# Patient Record
Sex: Male | Born: 1985 | Race: Black or African American | Hispanic: No | Marital: Single | Smoking: Never smoker
Health system: Southern US, Community
[De-identification: ages and names within clinical notes are randomized; demographics above are authoritative.]

## PROBLEM LIST (undated history)

## (undated) HISTORY — PX: EYE SURGERY: SHX253

---
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
## 2007-07-12 IMAGING — CR RIGHT FOOT COMPLETE - 3+ VIEW
1 series · 3 of 3 positions shown · non-contrast
Comparison: none

REASON FOR EXAM: Injury
COMMENTS:  LMP: (Male)

PROCEDURE:     DXR - DXR FOOT RT COMPLETE W/OBLIQUES  - May 23, 2006  [DATE]
RESULT:     Three views of the RIGHT foot show no fracture, dislocation or
other acute bony abnormality.

[Series 1: view not recorded · 0.17mm/px · 3 of 3 slices shown]
[im 1/3]
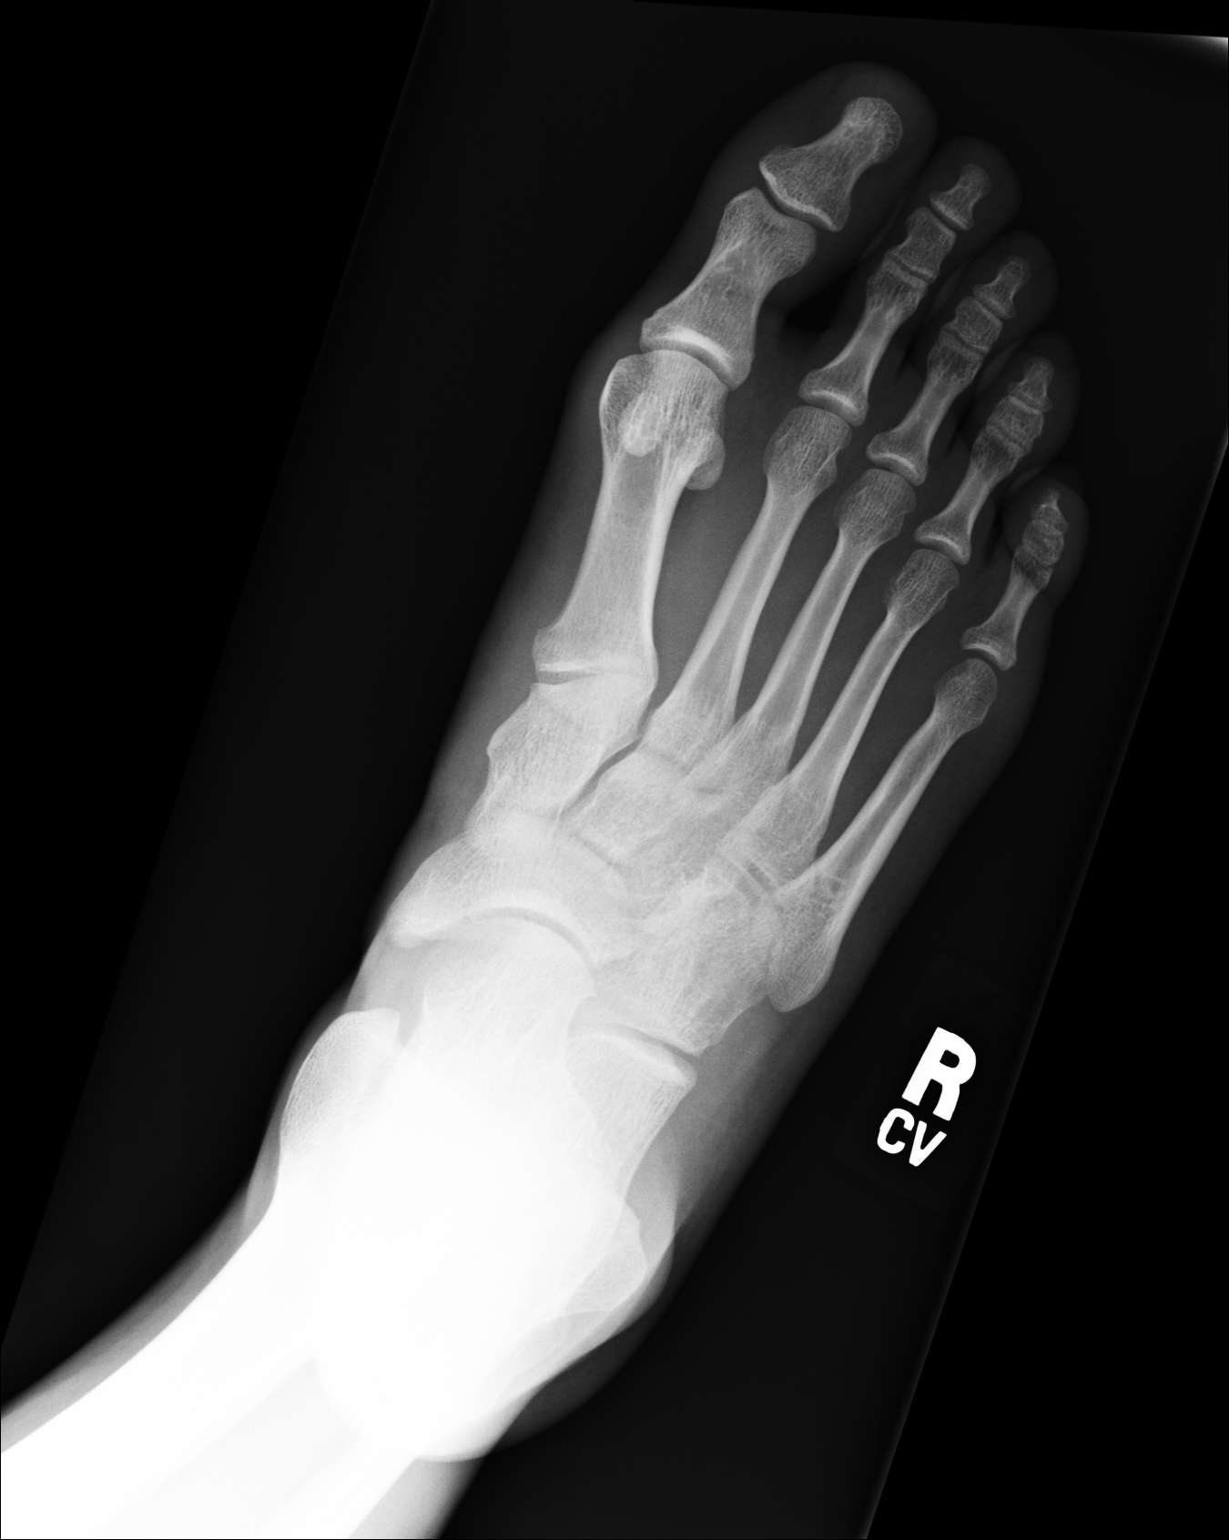
[im 2/3]
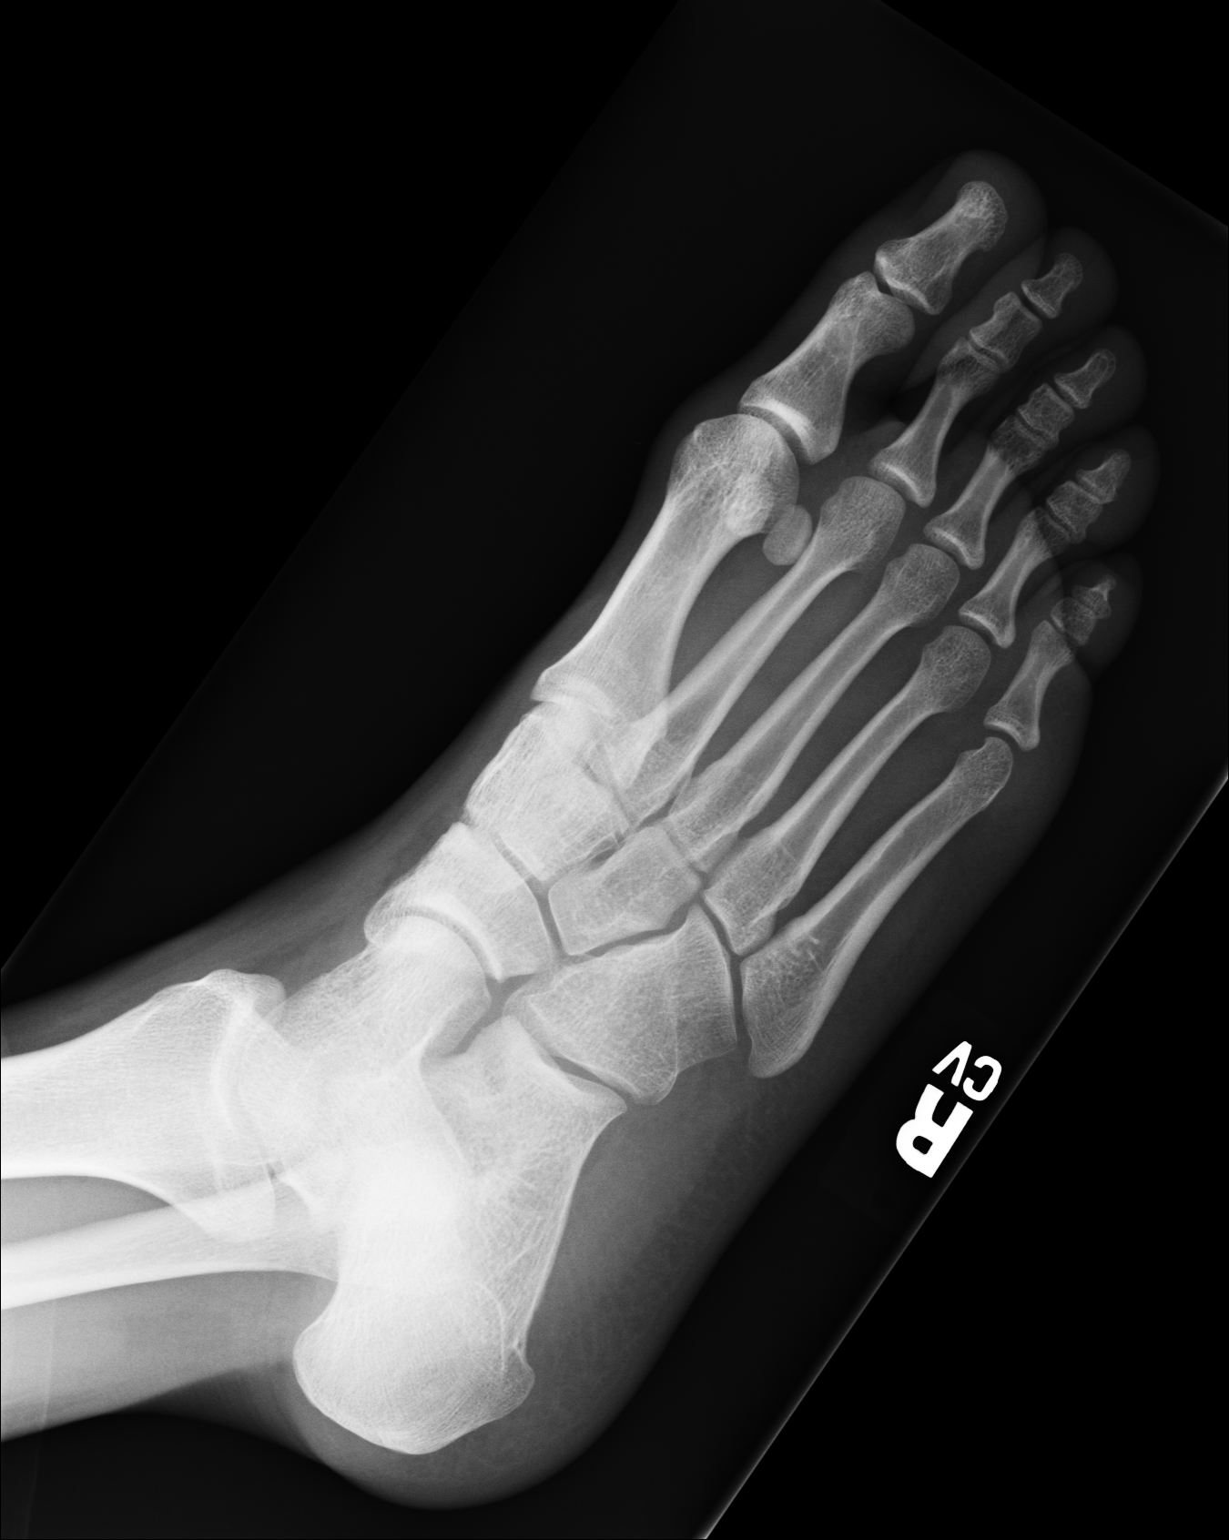
[im 3/3]
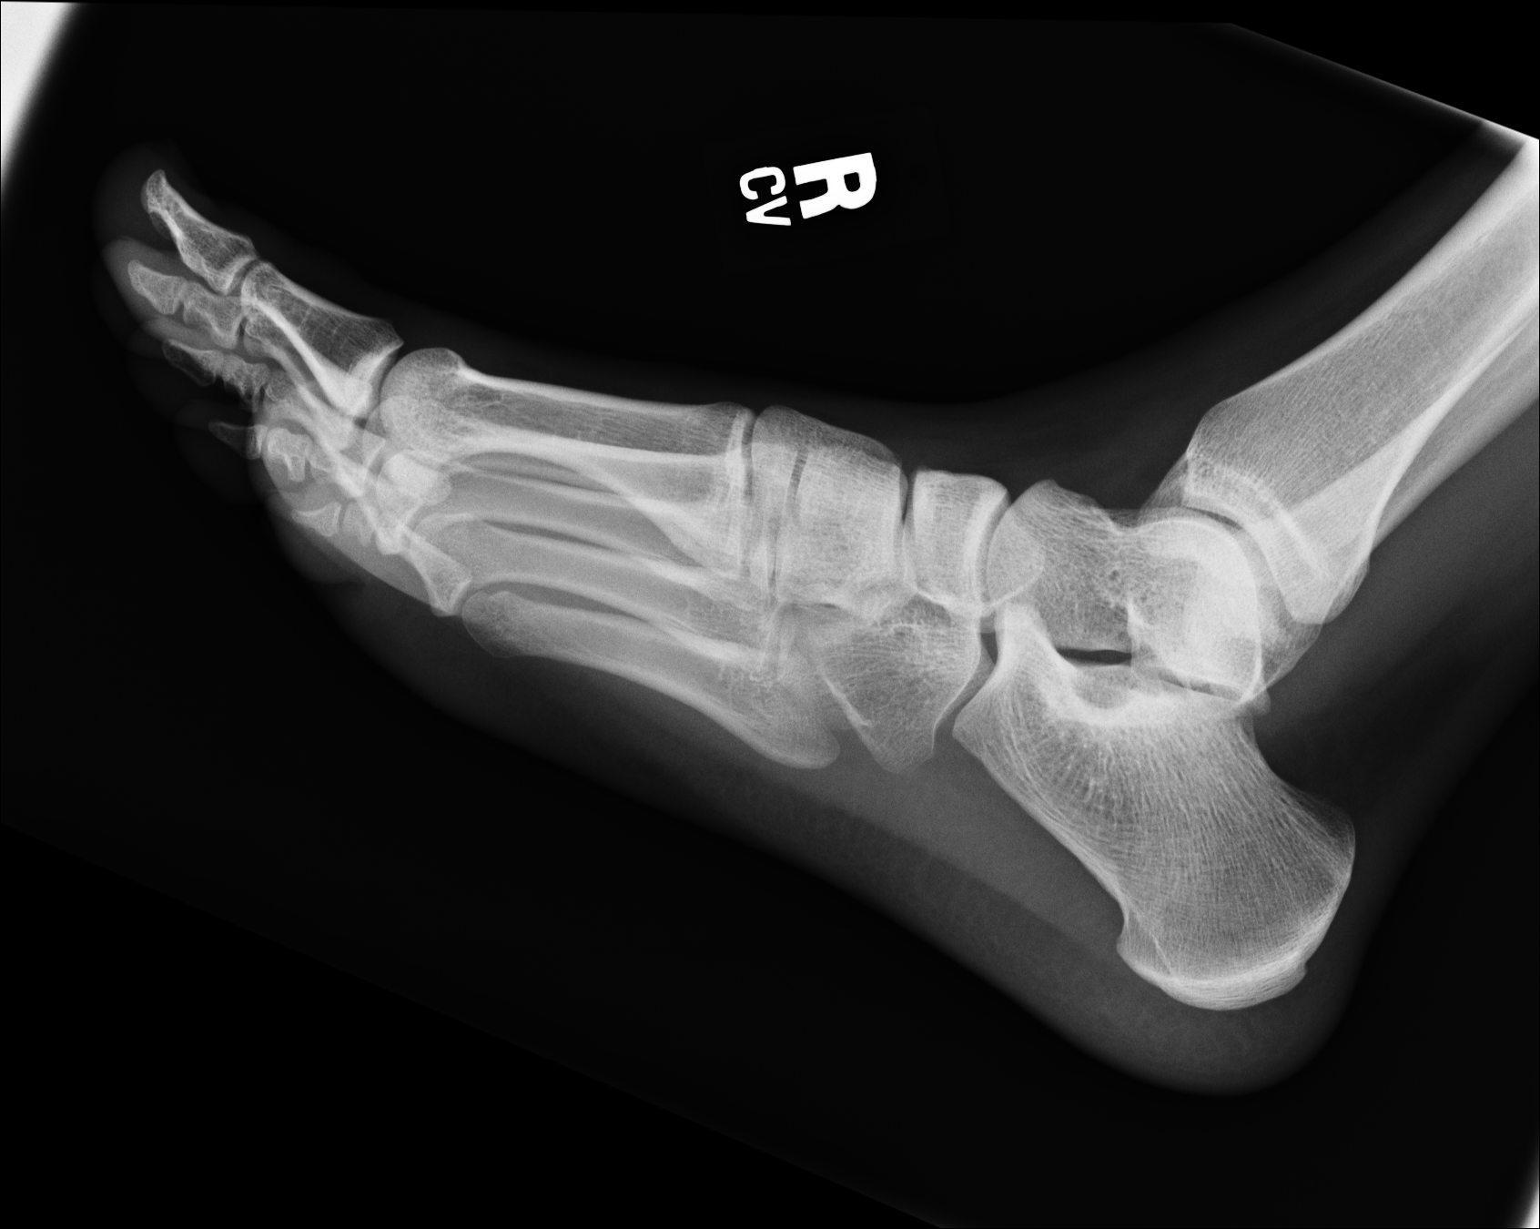

[3 of 3 positions shown; findings below may reference images not displayed]

IMPRESSION: No acute changes are identified.

## 2014-02-21 ENCOUNTER — Encounter (HOSPITAL_COMMUNITY): Payer: Self-pay | Admitting: Emergency Medicine

## 2014-02-21 ENCOUNTER — Emergency Department (HOSPITAL_COMMUNITY)
Admission: EM | Admit: 2014-02-21 | Discharge: 2014-02-21 | Disposition: A | Discharge status: Home / Self Care | Payer: Self-pay | Attending: Emergency Medicine | Admitting: Emergency Medicine

## 2014-02-21 DIAGNOSIS — K0889 Other specified disorders of teeth and supporting structures: Secondary | ICD-10-CM

## 2014-02-21 DIAGNOSIS — K089 Disorder of teeth and supporting structures, unspecified: Secondary | ICD-10-CM | POA: Insufficient documentation

## 2014-02-21 DIAGNOSIS — Z792 Long term (current) use of antibiotics: Secondary | ICD-10-CM | POA: Insufficient documentation

## 2014-02-21 MED ORDER — ONDANSETRON 4 MG PO TBDP
8.0000 mg | ORAL_TABLET | Freq: Once | ORAL | Status: AC
  Administered 2014-02-21: 8 mg via ORAL
  Filled 2014-02-21: qty 2

## 2014-02-21 MED ORDER — HYDROCODONE-ACETAMINOPHEN 5-325 MG PO TABS: 1.0000 | ORAL_TABLET | Freq: Four times a day (QID) | ORAL | Status: AC | PRN

## 2014-02-21 MED ORDER — PENICILLIN V POTASSIUM 500 MG PO TABS: 500.0000 mg | ORAL_TABLET | Freq: Four times a day (QID) | ORAL | Status: AC

## 2014-02-21 MED ORDER — HYDROCODONE-ACETAMINOPHEN 5-325 MG PO TABS
2.0000 | ORAL_TABLET | Freq: Once | ORAL | Status: AC
  Administered 2014-02-21: 2 via ORAL
  Filled 2014-02-21: qty 2

## 2014-02-21 NOTE — ED Provider Notes (Signed)
CSN: [id number]     Arrival date & time 02/21/14  4098 History  This chart was scribed for non-physician practitioner Junious Silk PA-C working with Gerhard Munch, MD by Annye Asa, ED Scribe. This patient was seen in room TR07C/TR07C and the patient's care was started at 9:25 AM.     Chief Complaint  Patient presents with  . Dental Pain   The history is provided by the patient. No language interpreter was used.    HPI Comments: Alexander Moore is a 28 y.o. male who presents to the Emergency Department complaining of gradual onset, constant, worsening dental pain (bilateral lower wisdom) beginning two weeks ago with associated jaw pain. Patient states that his pain is a sharp, cutting pain rated 10/10 at present. He has not seen a dentist at this time due to financial limitations. He is currently taking pain medications (hydrocodone, prescribed by physicians at another hospital) but states they do not alleviate his pain. He denies fevers and chills.    History reviewed. No pertinent past medical history. Past Surgical History  Procedure Laterality Date  . Eye surgery     No family history on file. History  Substance Use Topics  . Smoking status: Never Smoker   . Smokeless tobacco: Not on file  . Alcohol Use: No    Review of Systems  Constitutional: Negative for fever and chills.  HENT: Positive for dental problem.   All other systems reviewed and are negative.    Allergies  Review of patient's allergies indicates not on file.  Home Medications   Prior to Admission medications   Medication Sig Start Date End Date Taking? Authorizing Provider  PRESCRIPTION MEDICATION Take 1 tablet by mouth daily. antibiotic   Yes Historical Provider, MD  PRESCRIPTION MEDICATION Take 1 tablet by mouth every 4 (four) hours. Pain medication   Yes Historical Provider, MD   BP 144/98  Pulse 74  Temp(Src) 97.9 F (36.6 C) (Oral)  Resp 17  Ht  (1.753 m)  Wt 207 lb (93.895 kg)  BMI  30.55 kg/m2  SpO2 100% Physical Exam  Nursing note and vitals reviewed. Constitutional: He is oriented to person, place, and time. He appears well-developed and well-nourished. No distress.  HENT:  Head: Normocephalic and atraumatic.  Right Ear: External ear normal.  Left Ear: External ear normal.  Nose: Nose normal.  Erupting lower wisdom teeth bilaterally with surrounding gum irritation No drainable abscess No trismus, submental edema, no tongue elevation  Eyes: Conjunctivae are normal.  Neck: Normal range of motion. No tracheal deviation present.  Cardiovascular: Normal rate, regular rhythm and normal heart sounds.   Pulmonary/Chest: Effort normal and breath sounds normal. No stridor.  Abdominal: Soft. He exhibits no distension. There is no tenderness.  Musculoskeletal: Normal range of motion.  Neurological: He is alert and oriented to person, place, and time.  Skin: Skin is warm and dry. He is not diaphoretic.  Psychiatric: He has a normal mood and affect. His behavior is normal.    ED Course  Procedures  DIAGNOSTIC STUDIES: Oxygen Saturation is 100% on RA, normal by my interpretation.    COORDINATION OF CARE: 9:25 AM Discussed treatment plan with pt at bedside; patient will need to see a dentist to have his wisdom teeth removed. Patient will be prescribed pain medication and abx. Patient agreed to plan.  Labs Review Labs Reviewed - No data to display  Imaging Review No results found.   EKG Interpretation None      MDM  Final diagnoses:  Pain, dental   Patient with toothache.  No gross abscess.  Exam unconcerning for Ludwig's angina or spread of infection.  Will treat with penicillin and pain medicine.  Urged patient to follow-up with dentist.    I personally performed the services described in this documentation, which was scribed in my presence. The recorded information has been reviewed and is accurate.      Mora Bellman, PA-C 02/21/14 1016

## 2014-02-21 NOTE — ED Provider Notes (Signed)
Medical screening examination/treatment/procedure(s) were performed by non-physician practitioner and as supervising physician I was immediately available for consultation/collaboration.  Jalacia Mattila, MD 02/21/14 1550 

## 2014-02-21 NOTE — Discharge Instructions (Signed)
Dental Pain °A tooth ache may be caused by cavities (tooth decay). Cavities expose the nerve of the tooth to air and hot or cold temperatures. It may come from an infection or abscess (also called a boil or furuncle) around your tooth. It is also often caused by dental caries (tooth decay). This causes the pain you are having. °DIAGNOSIS  °Your caregiver can diagnose this problem by exam. °TREATMENT  °· If caused by an infection, it may be treated with medications which kill germs (antibiotics) and pain medications as prescribed by your caregiver. Take medications as directed. °· Only take over-the-counter or prescription medicines for pain, discomfort, or fever as directed by your caregiver. °· Whether the tooth ache today is caused by infection or dental disease, you should see your dentist as soon as possible for further care. °SEEK MEDICAL CARE IF: °The exam and treatment you received today has been provided on an emergency basis only. This is not a substitute for complete medical or dental care. If your problem worsens or new problems (symptoms) appear, and you are unable to meet with your dentist, call or return to this location. °SEEK IMMEDIATE MEDICAL CARE IF:  °· You have a fever. °· You develop redness and swelling of your face, jaw, or neck. °· You are unable to open your mouth. °· You have severe pain uncontrolled by pain medicine. °MAKE SURE YOU:  °· Understand these instructions. °· Will watch your condition. °· Will get help right away if you are not doing well or get worse. °Document Released: 05/24/2005 Document Revised: 08/16/2011 Document Reviewed: 01/10/2008 °[employer]® Patient Information ©2015 [employer]. This information is not intended to replace advice given to you by your health care provider. Make sure you discuss any questions you have with your health care provider. °Return to the emergency room for fever, change in vision, redness to the face that rapidly spreads towards the eye,  nausea or vomiting, difficulty swallowing or shortness of breath. ° °You may follow with the dentist listed above. Alternatively,  you may also contact [facility] who run a low-cost dental clinic at their phone number is [phone]. You may also call [phone] ° °Dental Assistance °If the dentist on-call cannot see you, please use the resources below: ° ° °Patients with Medicaid: [facility] °[address], [phone] °[address], [phone] ° °If unable to pay, or uninsured, contact [facility] (271-5999) or [facility] ([phone] in [place], [phone] in [place]) to become qualified for the adult dental clinic ° °Other Low-Cost Community Dental Services: °[facility]- [address], [place], [place], [address] °   [phone], Ext. 123 °   2nd and 4th Thursday of the month at 6:30am °   10 clients each day by appointment, can sometimes see walk-in     patients if someone does not show for an appointment °[facility]- [address], [place], [place], [address] °   [phone] °[facility]- [address], [place], [place], [address] °   [phone] ° °[facility]- [phone] °[facility]- [phone] °[facility]- [phone] ° °

## 2014-02-21 NOTE — ED Notes (Signed)
C/o bilat lower dental pain X2 weeks, no fever or other complaints, A/O X4, ambulatory and in NAD

## 2014-02-22 NOTE — Discharge Planning (Signed)
[facility] Community Liaison  Spoke to patient regarding primary care resouce and establishing care with a provider. Resource guide and my contact information provided for any future questions or concerns. No other Community Liaison needs identified at this time.
